# Patient Record
Sex: Male | Born: 2000 | Race: Black or African American | Hispanic: No | Marital: Single
Health system: Southern US, Community
[De-identification: ages and names within clinical notes are randomized; demographics above are authoritative.]

## PROBLEM LIST (undated history)

## (undated) DIAGNOSIS — J45909 Unspecified asthma, uncomplicated: Secondary | ICD-10-CM

## (undated) DIAGNOSIS — L309 Dermatitis, unspecified: Secondary | ICD-10-CM

## (undated) HISTORY — DX: Dermatitis, unspecified: L30.9

## (undated) HISTORY — PX: NO PAST SURGERIES: SHX2092

## (undated) HISTORY — DX: Unspecified asthma, uncomplicated: J45.909

---
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
## 2021-02-05 ENCOUNTER — Telehealth: Payer: Self-pay

## 2021-02-05 DIAGNOSIS — Z09 Encounter for follow-up examination after completed treatment for conditions other than malignant neoplasm: Secondary | ICD-10-CM

## 2021-02-05 NOTE — Telephone Encounter (Signed)
[facility] mailed pt [facility]cent [facility] dismissal letter. Pt is 21 y.o. and has not been seen in [facility] clinic in 3 years. Pt will need to update his PCP, or select an adult primary care provider. Pt can use [facility] webpage "find a doctor" or research on his private insurance webpage for preferred providers.  Pt also marked dismissed from [place], [place], [place] and [facility] Office: [phone] Direct Number: [phone]
